# Patient Record
Sex: Male | Born: 1995 | Race: White | Hispanic: No | Marital: Single | Smoking: Current every day smoker
Health system: Southern US, Community
[De-identification: ages and names within clinical notes are randomized; demographics above are authoritative.]

---

## 2021-03-18 ENCOUNTER — Ambulatory Visit: Payer: Self-pay

## 2021-05-05 ENCOUNTER — Ambulatory Visit (INDEPENDENT_AMBULATORY_CARE_PROVIDER_SITE_OTHER): Payer: Medicaid Other

## 2021-05-05 ENCOUNTER — Other Ambulatory Visit: Payer: Self-pay

## 2021-05-05 ENCOUNTER — Ambulatory Visit
Admission: RE | Admit: 2021-05-05 | Discharge: 2021-05-05 | Disposition: A | Discharge status: Home / Self Care | Payer: Medicaid Other | Source: Ambulatory Visit | Attending: Emergency Medicine | Admitting: Emergency Medicine

## 2021-05-05 VITALS — BP 137/92 | HR 125 | Temp 99.3°F | Resp 20

## 2021-05-05 DIAGNOSIS — R059 Cough, unspecified: Secondary | ICD-10-CM

## 2021-05-05 DIAGNOSIS — J209 Acute bronchitis, unspecified: Secondary | ICD-10-CM

## 2021-05-05 MED ORDER — PREDNISONE 10 MG (21) PO TBPK: ORAL_TABLET | Freq: Every day | ORAL | 0 refills | Status: AC

## 2021-05-05 MED ORDER — AZITHROMYCIN 250 MG PO TABS: 250.0000 mg | ORAL_TABLET | Freq: Every day | ORAL | 0 refills | Status: AC

## 2021-05-05 MED ORDER — ALBUTEROL SULFATE HFA 108 (90 BASE) MCG/ACT IN AERS: 1.0000 | INHALATION_SPRAY | Freq: Four times a day (QID) | RESPIRATORY_TRACT | 0 refills | Status: AC | PRN

## 2021-05-05 NOTE — ED Provider Notes (Signed)
?[facility] ? ? ? ?CSN: [id number] ?Arrival date & time: 05/05/21  1338 ? ? ?  ? ?History   ?Chief Complaint ?Chief Complaint  ?Patient presents with  ? Cough  ? Hoarse  ? ? ?HPI ?Tyler Morrison is a 26 y.o. male.  Patient presents with 2 week history of cough, worse x 5 days.  He has chills, sore throat, hoarse voice, chest pain with cough, shortness of breath x 5 days.  No fever, rash, vomiting, diarrhea, or other symptoms. Treatment at home with Mucinex.  Current everyday smoker.  History of asthma in childhood.   ? ?The history is provided by the patient.  ? ?History reviewed. No pertinent past medical history. ? ?There are no problems to display for this patient. ? ? ?History reviewed. No pertinent surgical history. ? ? ? ? ?Home Medications   ? ?Prior to Admission medications   ?Medication Sig Start Date End Date Taking? Authorizing Provider  ?albuterol (VENTOLIN HFA) 108 (90 Base) MCG/ACT inhaler Inhale 1-2 puffs into the lungs every 6 (six) hours as needed for wheezing or shortness of breath. 05/05/21  Yes Sharion Balloon, NP  ?azithromycin (ZITHROMAX) 250 MG tablet Take 1 tablet (250 mg total) by mouth daily. Take first 2 tablets together, then 1 every day until finished. 05/05/21  Yes Sharion Balloon, NP  ?predniSONE (STERAPRED UNI-PAK 21 TAB) 10 MG (21) TBPK tablet Take by mouth daily. As directed 05/05/21  Yes Sharion Balloon, NP  ? ? ?Family History ?Family History  ?Family history unknown: Yes  ? ? ?Social History ?Social History  ? ?Tobacco Use  ? Smoking status: Every Day  ?  Packs/day: 0.50  ?  Years: 7.00  ?  Pack years: 3.50  ?  Types: Cigarettes  ? Smokeless tobacco: Never  ?Substance Use Topics  ? Drug use: Never  ? ? ? ?Allergies   ?Patient has no known allergies. ? ? ?Review of Systems ?Review of Systems  ?Constitutional:  Positive for chills. Negative for fever.  ?HENT:  Positive for congestion, sore throat and voice change. Negative for ear pain.   ?Respiratory:  Positive for cough and  shortness of breath.   ?Cardiovascular:  Positive for chest pain. Negative for palpitations.  ?Gastrointestinal:  Negative for diarrhea and vomiting.  ?Skin:  Negative for color change and rash.  ?All other systems reviewed and are negative. ? ? ?Physical Exam ?Triage Vital Signs ?ED Triage Vitals  ?Enc Vitals Group  ?   BP   ?   Pulse   ?   Resp   ?   Temp   ?   Temp src   ?   SpO2   ?   Weight   ?   Height   ?   Head Circumference   ?   Peak Flow   ?   Pain Score   ?   Pain Loc   ?   Pain Edu?   ?   Excl. in [place]?   ? ?No data found. ? ?Updated Vital Signs ?BP (!) 137/92   Pulse (!) 125   Temp 99.3 ?F (37.4 ?C)   Resp 20   SpO2 95%  ? ?Visual Acuity ?Right Eye Distance:   ?Left Eye Distance:   ?Bilateral Distance:   ? ?Right Eye Near:   ?Left Eye Near:    ?Bilateral Near:    ? ?Physical Exam ?Vitals and nursing note reviewed.  ?Constitutional:   ?  General: He is not in acute distress. ?   Appearance: Normal appearance. He is well-developed. He is not ill-appearing.  ?HENT:  ?   Right Ear: Tympanic membrane normal.  ?   Left Ear: Tympanic membrane normal.  ?   Nose: Nose normal.  ?   Mouth/Throat:  ?   Mouth: Mucous membranes are moist.  ?   Pharynx: Oropharynx is clear.  ?Cardiovascular:  ?   Rate and Rhythm: Normal rate and regular rhythm.  ?   Heart sounds: Normal heart sounds.  ?Pulmonary:  ?   Effort: Pulmonary effort is normal. No respiratory distress.  ?   Breath sounds: Normal breath sounds.  ?   Comments: Few faint inspiratory wheezes; scattered rhonchi which clear with cough.  ?Musculoskeletal:  ?   Cervical back: Neck supple.  ?Skin: ?   General: Skin is warm and dry.  ?Neurological:  ?   Mental Status: He is alert.  ?Psychiatric:     ?   Mood and Affect: Mood normal.     ?   Behavior: Behavior normal.  ? ? ? ?UC Treatments / Results  ?Labs ?(all labs ordered are listed, but only abnormal results are displayed) ?Labs Reviewed - No data to display ? ?EKG ? ? ?Radiology ?DG Chest 2 View ? ?Result Date:  05/05/2021 ?CLINICAL DATA:  Productive cough and short of breath EXAM: CHEST - 2 VIEW COMPARISON:  None. FINDINGS: The heart size and mediastinal contours are within normal limits. Both lungs are clear. The visualized skeletal structures are unremarkable. IMPRESSION: No active cardiopulmonary disease. Electronically Signed   By: Franchot Gallo M.D.   On: 05/05/2021 14:30   ? ?Procedures ?Procedures (including critical care time) ? ?Medications Ordered in UC ?Medications - No data to display ? ?Initial Impression / Assessment and Plan / UC Course  ?I have reviewed the triage vital signs and the nursing notes. ? ?Pertinent labs & imaging results that were available during my care of the patient were reviewed by me and considered in my medical decision making (see chart for details). ? ?Acute bronchitis.  Chest x-ray clear.  Patient has been symptomatic for 2 weeks and is getting worse.  Treating with Zithromax, prednisone, albuterol inhaler.  ED precautions discussed.  Instructed patient to follow-up with his PCP if his symptoms are not improving.  He agrees to plan of care. ? ? ?Final Clinical Impressions(s) / UC Diagnoses  ? ?Final diagnoses:  ?Acute bronchitis, unspecified organism  ? ? ? ?Discharge Instructions   ? ?  ?The Zithromax and prednisone as directed.  Use the albuterol inhaler as directed.  Follow up with your primary care provider if your symptoms are not improving.   ? ?Go to the emergency department if you have acute shortness of breath or other concerning symptoms. ? ? ? ? ?ED Prescriptions   ? ? Medication Sig Dispense Auth. Provider  ? albuterol (VENTOLIN HFA) 108 (90 Base) MCG/ACT inhaler Inhale 1-2 puffs into the lungs every 6 (six) hours as needed for wheezing or shortness of breath. 18 g Sharion Balloon, NP  ? predniSONE (STERAPRED UNI-PAK 21 TAB) 10 MG (21) TBPK tablet Take by mouth daily. As directed 21 tablet Sharion Balloon, NP  ? azithromycin (ZITHROMAX) 250 MG tablet Take 1 tablet (250 mg  total) by mouth daily. Take first 2 tablets together, then 1 every day until finished. 6 tablet Sharion Balloon, NP  ? ?  ? ?PDMP not reviewed this encounter. ?  ?  Sharion Balloon, NP ?05/05/21 1453 ? ?

## 2021-05-05 NOTE — ED Triage Notes (Signed)
Pt here with cough and loss of voice from coughing for about 10 days.  ?

## 2021-05-05 NOTE — Discharge Instructions (Addendum)
The Zithromax and prednisone as directed.  Use the albuterol inhaler as directed.  Follow up with your primary care provider if your symptoms are not improving.   ? ?Go to the emergency department if you have acute shortness of breath or other concerning symptoms. ?

## 2021-05-10 ENCOUNTER — Ambulatory Visit
Admission: RE | Admit: 2021-05-10 | Discharge: 2021-05-10 | Disposition: A | Discharge status: Home / Self Care | Payer: Medicaid Other | Source: Ambulatory Visit

## 2021-05-10 ENCOUNTER — Other Ambulatory Visit: Payer: Self-pay

## 2021-05-10 VITALS — BP 153/107 | HR 116 | Temp 98.6°F | Resp 18

## 2021-05-10 DIAGNOSIS — R030 Elevated blood-pressure reading, without diagnosis of hypertension: Secondary | ICD-10-CM

## 2021-05-10 DIAGNOSIS — J209 Acute bronchitis, unspecified: Secondary | ICD-10-CM | POA: Diagnosis not present

## 2021-05-10 NOTE — Discharge Instructions (Signed)
Follow-up with your primary care provider as needed.

## 2021-05-10 NOTE — ED Provider Notes (Signed)
?[facility] ? ? ? ?CSN: [id number] ?Arrival date & time: 05/10/21  1148 ? ? ?  ? ?History   ?Chief Complaint ?Chief Complaint  ?Patient presents with  ? Cough  ?  Stomach pain, return visit - Entered by patient  ? Abdominal Pain  ? ? ?HPI ?Tyler Morrison is a 26 y.o. male.  Patient presents with ongoing cough which is improving and nonproductive.  His voice is hoarse but getting stronger.  He also had generalized abdominal pain which resolved yesterday; no pain today.  He denies fever, chills, rash, shortness of breath, vomiting, diarrhea, constipation, or other symptoms.  Last bowel movement this morning.  Patient was seen here on 05/05/2021; diagnosed with acute bronchitis; chest x-ray clear at that time; treated with prednisone, albuterol, Zithromax.  Patient stopped taking the prednisone after 2 days due to his abdominal pain which he attributed to the prednisone.  He completed the Zithromax and used the albuterol inhaler as needed.  He reports history of asthma as a child.  Current every day smoker but he is trying to cut back. ? ?The history is provided by the patient and medical records.  ? ?History reviewed. No pertinent past medical history. ? ?There are no problems to display for this patient. ? ? ?History reviewed. No pertinent surgical history. ? ? ? ? ?Home Medications   ? ?Prior to Admission medications   ?Medication Sig Start Date End Date Taking? Authorizing Provider  ?albuterol (VENTOLIN HFA) 108 (90 Base) MCG/ACT inhaler Inhale 1-2 puffs into the lungs every 6 (six) hours as needed for wheezing or shortness of breath. 05/05/21   Sharion Balloon, NP  ?azithromycin (ZITHROMAX) 250 MG tablet Take 1 tablet (250 mg total) by mouth daily. Take first 2 tablets together, then 1 every day until finished. 05/05/21   Sharion Balloon, NP  ?predniSONE (STERAPRED UNI-PAK 21 TAB) 10 MG (21) TBPK tablet Take by mouth daily. As directed 05/05/21   Sharion Balloon, NP  ? ? ?Family History ?Family History  ?Family  history unknown: Yes  ? ? ?Social History ?Social History  ? ?Tobacco Use  ? Smoking status: Every Day  ?  Packs/day: 0.50  ?  Years: 7.00  ?  Pack years: 3.50  ?  Types: Cigarettes  ? Smokeless tobacco: Never  ?Substance Use Topics  ? Drug use: Never  ? ? ? ?Allergies   ?Patient has no known allergies. ? ? ?Review of Systems ?Review of Systems  ?Constitutional:  Negative for chills and fever.  ?HENT:  Positive for voice change. Negative for ear pain and sore throat.   ?Respiratory:  Positive for cough. Negative for shortness of breath and wheezing.   ?Cardiovascular:  Negative for chest pain and palpitations.  ?Gastrointestinal:  Positive for abdominal pain. Negative for constipation, diarrhea, nausea and vomiting.  ?Genitourinary:  Negative for dysuria and hematuria.  ?Skin:  Negative for color change and rash.  ?All other systems reviewed and are negative. ? ? ?Physical Exam ?Triage Vital Signs ?ED Triage Vitals  ?Enc Vitals Group  ?   BP   ?   Pulse   ?   Resp   ?   Temp   ?   Temp src   ?   SpO2   ?   Weight   ?   Height   ?   Head Circumference   ?   Peak Flow   ?   Pain Score   ?  Pain Loc   ?   Pain Edu?   ?   Excl. in [place]?   ? ?No data found. ? ?Updated Vital Signs ?BP (!) 153/107   Pulse (!) 116   Temp 98.6 ?F (37 ?C)   Resp 18   SpO2 97%  ? ?Visual Acuity ?Right Eye Distance:   ?Left Eye Distance:   ?Bilateral Distance:   ? ?Right Eye Near:   ?Left Eye Near:    ?Bilateral Near:    ? ?Physical Exam ?Vitals and nursing note reviewed.  ?Constitutional:   ?   General: He is not in acute distress. ?   Appearance: He is well-developed. He is obese. He is not ill-appearing.  ?HENT:  ?   Right Ear: Tympanic membrane normal.  ?   Left Ear: Tympanic membrane normal.  ?   Nose: Nose normal.  ?   Mouth/Throat:  ?   Mouth: Mucous membranes are moist.  ?   Pharynx: Oropharynx is clear.  ?Eyes:  ?   Conjunctiva/sclera: Conjunctivae normal.  ?Cardiovascular:  ?   Rate and Rhythm: Normal rate and regular rhythm.  ?    Heart sounds: Normal heart sounds.  ?Pulmonary:  ?   Effort: Pulmonary effort is normal. No respiratory distress.  ?   Breath sounds: Normal breath sounds.  ?Abdominal:  ?   General: Bowel sounds are normal.  ?   Palpations: Abdomen is soft.  ?   Tenderness: There is no abdominal tenderness. There is no guarding or rebound.  ?Musculoskeletal:  ?   Cervical back: Neck supple.  ?Skin: ?   General: Skin is warm and dry.  ?Neurological:  ?   Mental Status: He is alert.  ?Psychiatric:     ?   Mood and Affect: Mood normal.     ?   Behavior: Behavior normal.  ? ? ? ?UC Treatments / Results  ?Labs ?(all labs ordered are listed, but only abnormal results are displayed) ?Labs Reviewed - No data to display ? ?EKG ? ? ?Radiology ?No results found. ? ?Procedures ?Procedures (including critical care time) ? ?Medications Ordered in UC ?Medications - No data to display ? ?Initial Impression / Assessment and Plan / UC Course  ?I have reviewed the triage vital signs and the nursing notes. ? ?Pertinent labs & imaging results that were available during my care of the patient were reviewed by me and considered in my medical decision making (see chart for details). ? ? Acute bronchitis, improving.  Elevated blood pressure reading.  Lungs are clear, O2 sat 97% on room air.  Patient has no abdominal pain today.  Discussed continued symptomatic treatment and use of albuterol inhaler as needed.  Also discussed with patient that his blood pressure is elevated today and needs to be rechecked by PCP in 2 to 4 weeks.  Education provided on managing hypertension.  Patient agrees to plan of care.  ? ? ?Final Clinical Impressions(s) / UC Diagnoses  ? ?Final diagnoses:  ?Acute bronchitis, unspecified organism  ?Elevated blood pressure reading  ? ? ? ?Discharge Instructions   ? ?  ?Follow up with your primary care provider as needed.  ? ? ? ? ? ?ED Prescriptions   ?None ?  ? ?PDMP not reviewed this encounter. ?  ?Sharion Balloon, NP ?05/10/21 1212 ? ?

## 2021-05-10 NOTE — ED Triage Notes (Signed)
Pt was seen 3/21 and returns with continuing cough with stomach discomfort.  ?

## 2022-07-15 IMAGING — DX DG CHEST 2V
2 series · 2 of 2 positions shown · non-contrast
Comparison: None.

CLINICAL DATA: Productive cough and short of breath

EXAM:
CHEST - 2 VIEW

[chest pa]
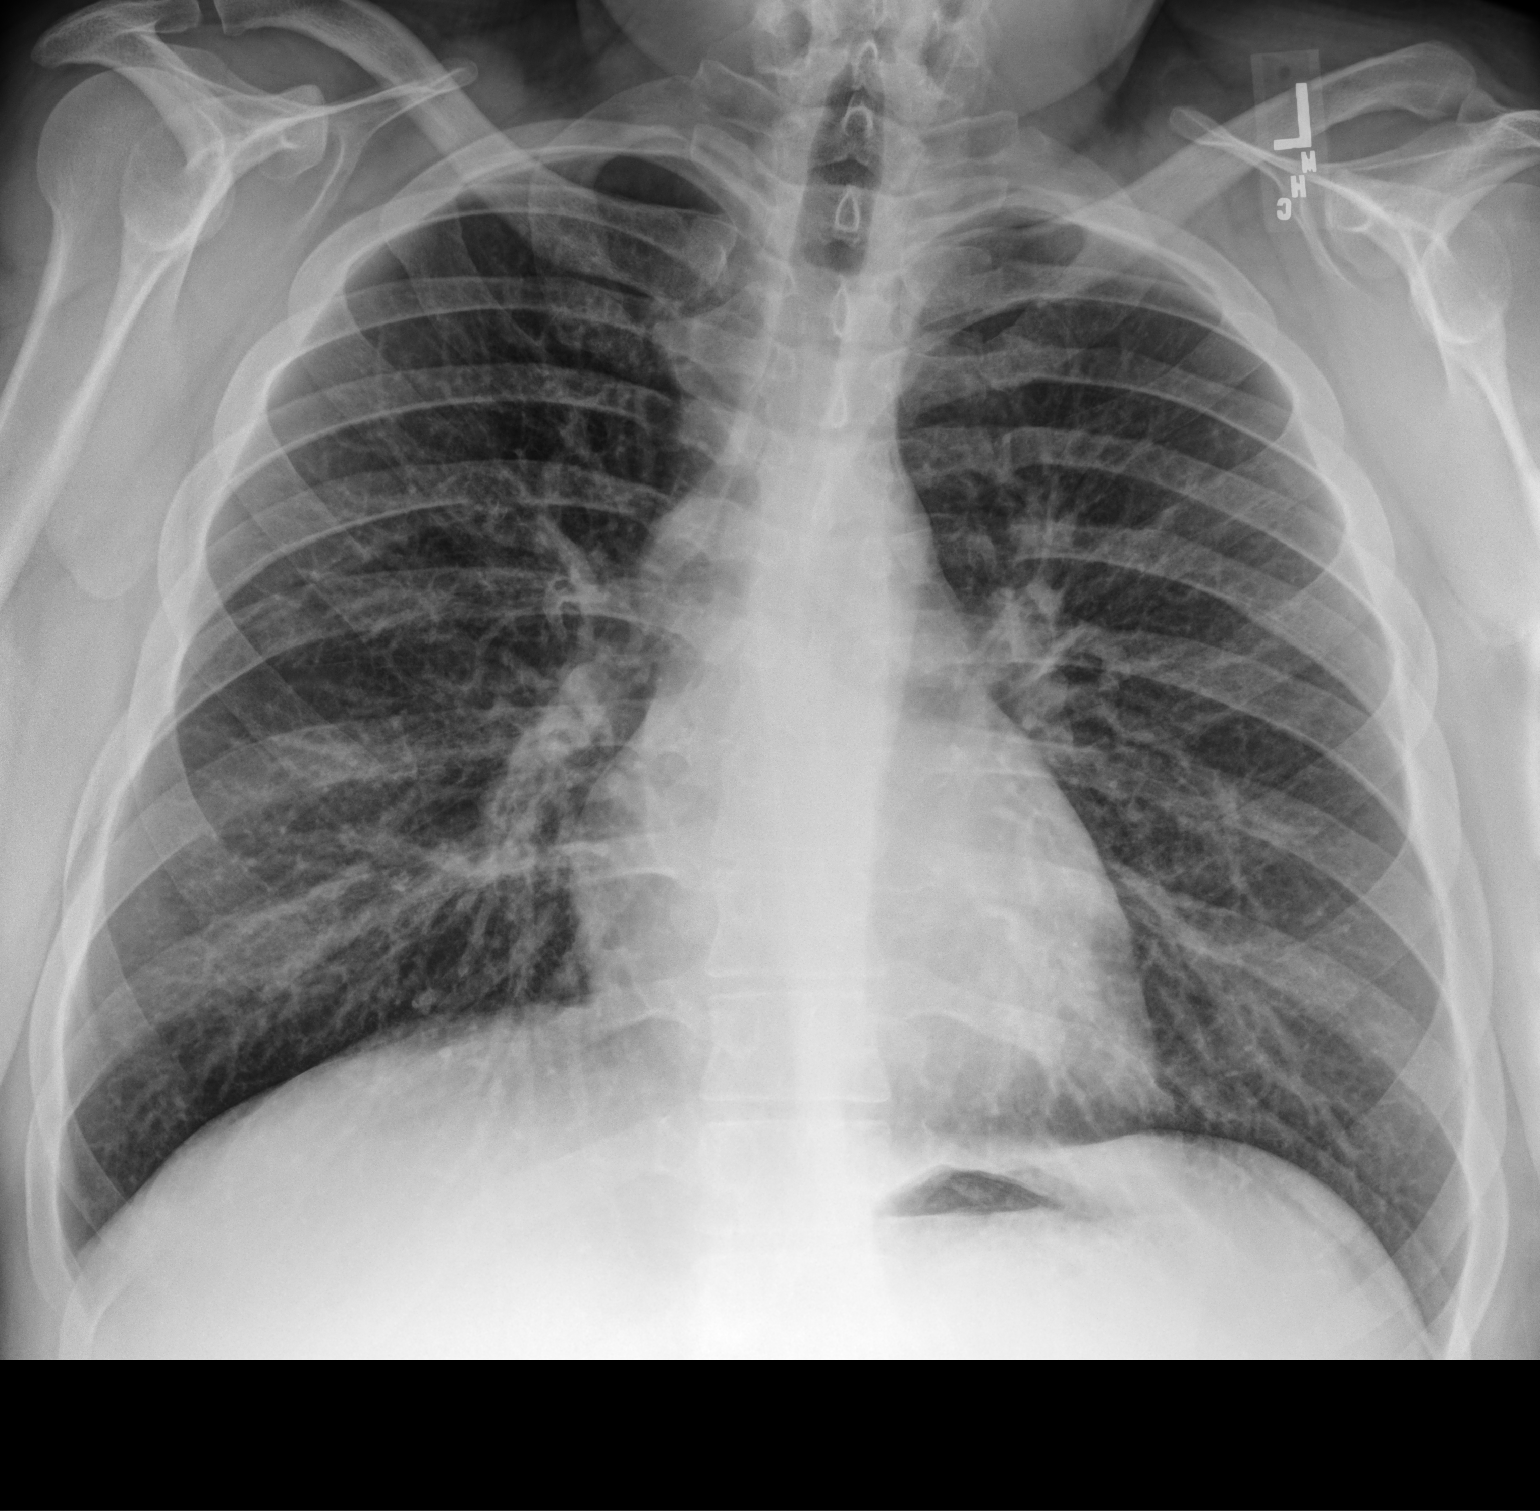

[chest lat]
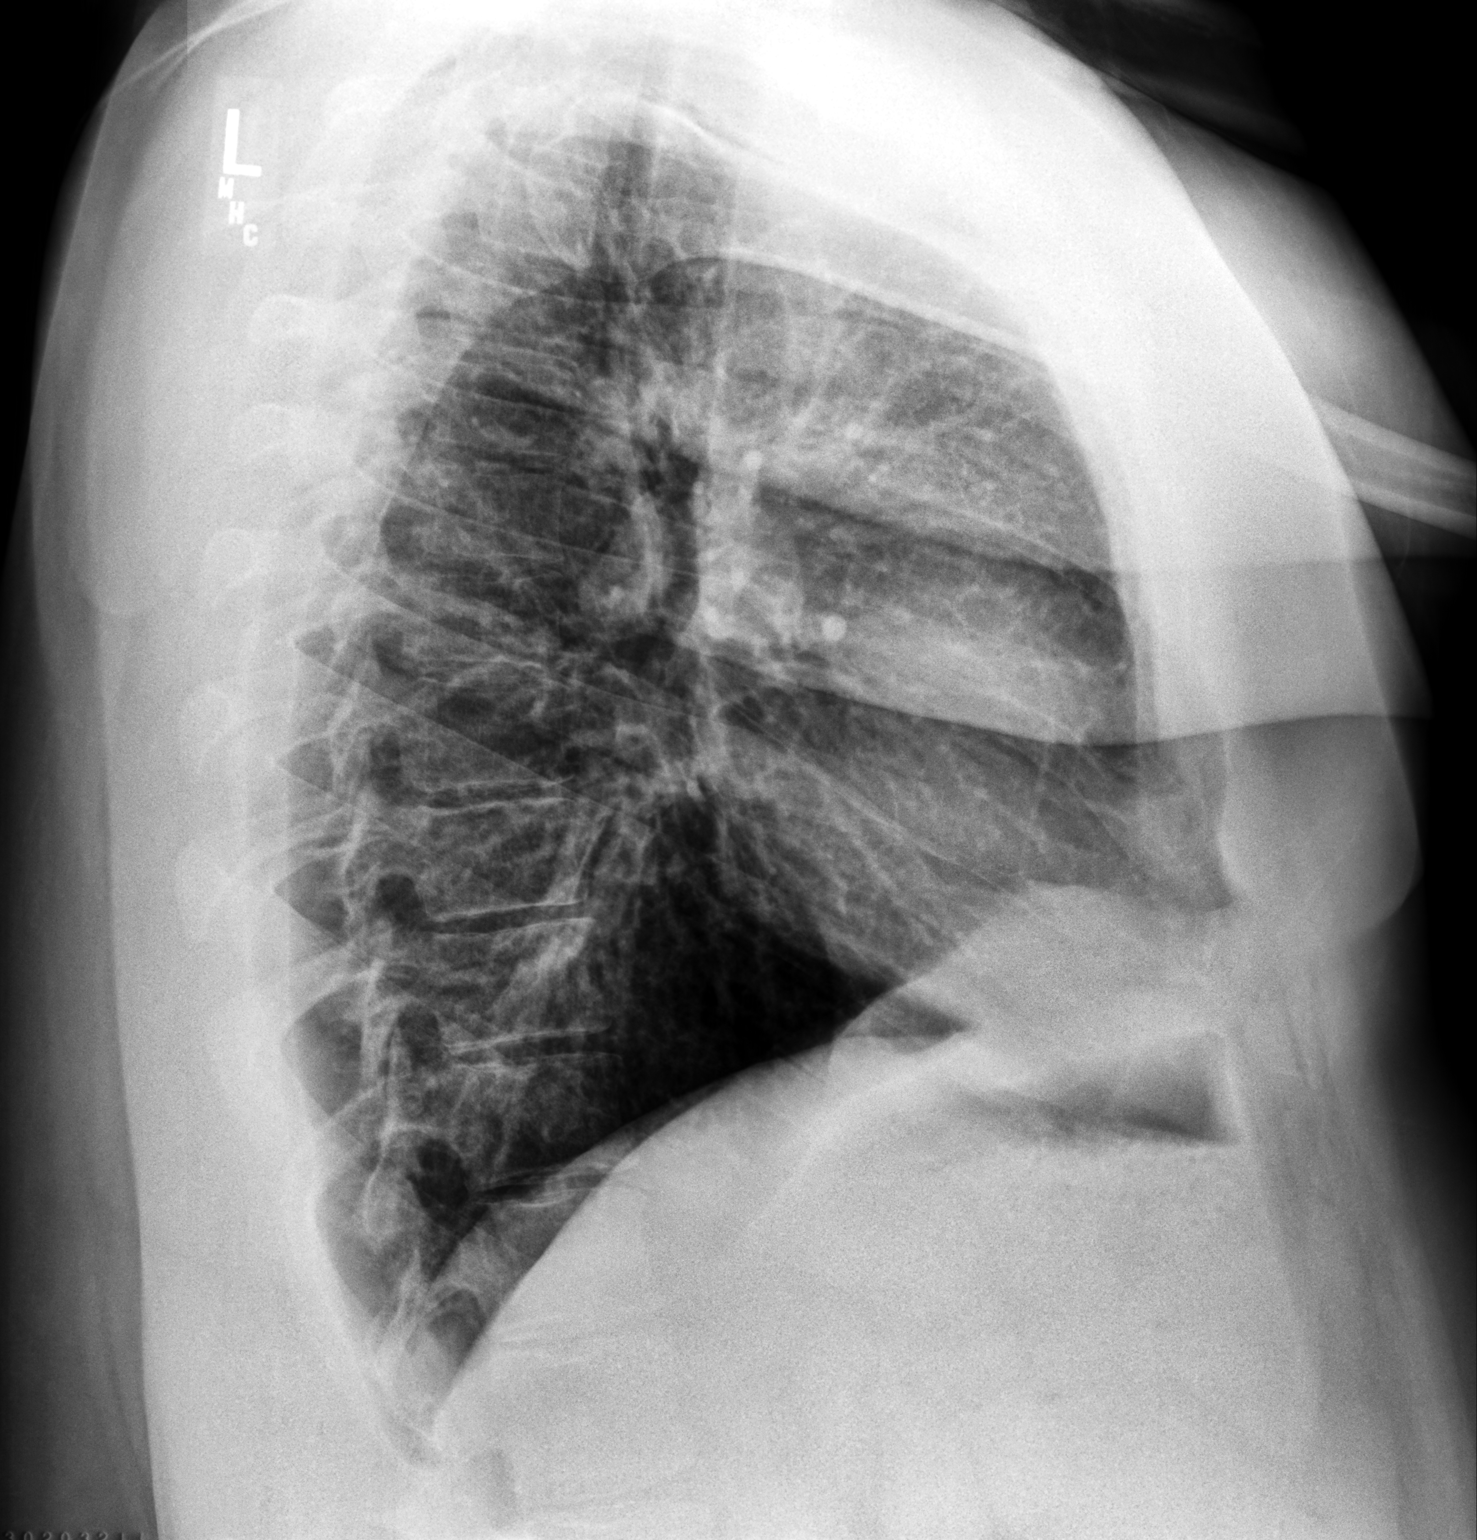

[2 of 2 positions shown; findings below may reference images not displayed]

FINDINGS: The heart size and mediastinal contours are within normal limits.
Both lungs are clear. The visualized skeletal structures are
unremarkable.
IMPRESSION: No active cardiopulmonary disease.
